# Patient Record
Sex: Male | Born: 1965
Health system: Southern US, Community
[De-identification: ages and names within clinical notes are randomized; demographics above are authoritative.]

---

## 2016-01-30 DIAGNOSIS — Z0000 Encounter for general adult medical examination without abnormal findings: Secondary | ICD-10-CM | POA: Diagnosis not present

## 2016-11-08 DIAGNOSIS — Z23 Encounter for immunization: Secondary | ICD-10-CM | POA: Diagnosis not present

## 2017-03-07 DIAGNOSIS — Z0000 Encounter for general adult medical examination without abnormal findings: Secondary | ICD-10-CM | POA: Diagnosis not present

## 2018-04-17 DIAGNOSIS — Z125 Encounter for screening for malignant neoplasm of prostate: Secondary | ICD-10-CM | POA: Diagnosis not present

## 2018-04-17 DIAGNOSIS — Z0000 Encounter for general adult medical examination without abnormal findings: Secondary | ICD-10-CM | POA: Diagnosis not present

## 2018-04-17 DIAGNOSIS — Z131 Encounter for screening for diabetes mellitus: Secondary | ICD-10-CM | POA: Diagnosis not present

## 2018-04-17 DIAGNOSIS — Z136 Encounter for screening for cardiovascular disorders: Secondary | ICD-10-CM | POA: Diagnosis not present

## 2018-06-12 DIAGNOSIS — K648 Other hemorrhoids: Secondary | ICD-10-CM | POA: Diagnosis not present

## 2018-06-12 DIAGNOSIS — D126 Benign neoplasm of colon, unspecified: Secondary | ICD-10-CM | POA: Diagnosis not present

## 2018-06-12 DIAGNOSIS — Z1211 Encounter for screening for malignant neoplasm of colon: Secondary | ICD-10-CM | POA: Diagnosis not present

## 2018-06-12 DIAGNOSIS — K5730 Diverticulosis of large intestine without perforation or abscess without bleeding: Secondary | ICD-10-CM | POA: Diagnosis not present

## 2018-06-17 DIAGNOSIS — Z1211 Encounter for screening for malignant neoplasm of colon: Secondary | ICD-10-CM | POA: Diagnosis not present

## 2018-06-17 DIAGNOSIS — D126 Benign neoplasm of colon, unspecified: Secondary | ICD-10-CM | POA: Diagnosis not present

## 2019-06-26 DIAGNOSIS — Z0000 Encounter for general adult medical examination without abnormal findings: Secondary | ICD-10-CM | POA: Diagnosis not present

## 2019-07-10 DIAGNOSIS — Z13220 Encounter for screening for lipoid disorders: Secondary | ICD-10-CM | POA: Diagnosis not present

## 2019-07-10 DIAGNOSIS — Z0000 Encounter for general adult medical examination without abnormal findings: Secondary | ICD-10-CM | POA: Diagnosis not present

## 2020-06-27 DIAGNOSIS — Z0000 Encounter for general adult medical examination without abnormal findings: Secondary | ICD-10-CM | POA: Diagnosis not present

## 2020-06-27 DIAGNOSIS — Z131 Encounter for screening for diabetes mellitus: Secondary | ICD-10-CM | POA: Diagnosis not present

## 2020-06-27 DIAGNOSIS — Z125 Encounter for screening for malignant neoplasm of prostate: Secondary | ICD-10-CM | POA: Diagnosis not present

## 2020-06-27 DIAGNOSIS — Z13220 Encounter for screening for lipoid disorders: Secondary | ICD-10-CM | POA: Diagnosis not present

## 2021-01-06 DIAGNOSIS — H0014 Chalazion left upper eyelid: Secondary | ICD-10-CM | POA: Diagnosis not present

## 2021-01-10 DIAGNOSIS — H0014 Chalazion left upper eyelid: Secondary | ICD-10-CM | POA: Diagnosis not present

## 2021-04-17 DIAGNOSIS — L723 Sebaceous cyst: Secondary | ICD-10-CM | POA: Diagnosis not present

## 2021-06-02 ENCOUNTER — Emergency Department (HOSPITAL_COMMUNITY): Payer: Worker's Compensation

## 2021-06-02 ENCOUNTER — Emergency Department (HOSPITAL_COMMUNITY)
Admission: EM | Admit: 2021-06-02 | Discharge: 2021-06-02 | Disposition: A | Discharge status: Home / Self Care | Payer: Worker's Compensation | Attending: Emergency Medicine | Admitting: Emergency Medicine

## 2021-06-02 DIAGNOSIS — W11XXXA Fall on and from ladder, initial encounter: Secondary | ICD-10-CM | POA: Insufficient documentation

## 2021-06-02 DIAGNOSIS — S53105A Unspecified dislocation of left ulnohumeral joint, initial encounter: Secondary | ICD-10-CM | POA: Diagnosis not present

## 2021-06-02 DIAGNOSIS — S52042D Displaced fracture of coronoid process of left ulna, subsequent encounter for closed fracture with routine healing: Secondary | ICD-10-CM | POA: Diagnosis not present

## 2021-06-02 DIAGNOSIS — M25512 Pain in left shoulder: Secondary | ICD-10-CM | POA: Diagnosis not present

## 2021-06-02 DIAGNOSIS — S42402A Unspecified fracture of lower end of left humerus, initial encounter for closed fracture: Secondary | ICD-10-CM

## 2021-06-02 DIAGNOSIS — S53102A Unspecified subluxation of left ulnohumeral joint, initial encounter: Secondary | ICD-10-CM | POA: Diagnosis not present

## 2021-06-02 DIAGNOSIS — S59901A Unspecified injury of right elbow, initial encounter: Secondary | ICD-10-CM | POA: Diagnosis not present

## 2021-06-02 DIAGNOSIS — S53105D Unspecified dislocation of left ulnohumeral joint, subsequent encounter: Secondary | ICD-10-CM | POA: Diagnosis not present

## 2021-06-02 MED ORDER — PROPOFOL 10 MG/ML IV BOLUS
40.0000 mg | Freq: Once | INTRAVENOUS | Status: AC
  Filled 2021-06-02: qty 20

## 2021-06-02 MED ORDER — OXYCODONE-ACETAMINOPHEN 5-325 MG PO TABS: 1.0000 | ORAL_TABLET | Freq: Three times a day (TID) | ORAL | 0 refills | Status: AC | PRN

## 2021-06-02 MED ORDER — FENTANYL CITRATE PF 50 MCG/ML IJ SOSY
50.0000 ug | PREFILLED_SYRINGE | Freq: Once | INTRAMUSCULAR | Status: AC
Start: 2021-06-02 — End: 2021-06-02
  Administered 2021-06-02: 50 ug via INTRAMUSCULAR
  Filled 2021-06-02: qty 1

## 2021-06-02 MED ORDER — PROPOFOL 10 MG/ML IV BOLUS
0.5000 mg/kg | Freq: Once | INTRAVENOUS | Status: AC
  Administered 2021-06-02: 39.5 mg via INTRAVENOUS

## 2021-06-02 MED ORDER — OXYCODONE-ACETAMINOPHEN 5-325 MG PO TABS
1.0000 | ORAL_TABLET | Freq: Once | ORAL | Status: AC
  Administered 2021-06-02: 1 via ORAL
  Filled 2021-06-02: qty 1

## 2021-06-02 MED ORDER — HYDROMORPHONE HCL 1 MG/ML IJ SOLN
1.0000 mg | Freq: Once | INTRAMUSCULAR | Status: AC
  Administered 2021-06-02: 1 mg via INTRAVENOUS
  Filled 2021-06-02: qty 1

## 2021-06-02 MED ORDER — PROPOFOL 10 MG/ML IV BOLUS
INTRAVENOUS | Status: AC
  Administered 2021-06-02: 40 mg via INTRAVENOUS
  Filled 2021-06-02: qty 20

## 2021-06-02 MED ORDER — FENTANYL CITRATE PF 50 MCG/ML IJ SOSY
75.0000 ug | PREFILLED_SYRINGE | Freq: Once | INTRAMUSCULAR | Status: AC
  Administered 2021-06-02: 75 ug via INTRAVENOUS
  Filled 2021-06-02: qty 2

## 2021-06-02 MED ORDER — SODIUM CHLORIDE 0.9 % IV BOLUS
1000.0000 mL | Freq: Once | INTRAVENOUS | Status: AC
  Administered 2021-06-02: 1000 mL via INTRAVENOUS

## 2021-06-02 NOTE — ED Provider Notes (Addendum)
[facility] EMERGENCY DEPARTMENT Provider Note   CSN: [id number] Arrival date & time: 06/02/21  1642     History Chief Complaint  Patient presents with   Marletta Lor    Jeffery Lyons is a 55 y.o. male.  HPI  Patient with no significant medical history presents to the emergency department with chief complaint of left elbow pain.  He states approximately 1 hour ago he was on a 4 foot step ladder, fell onto his left side, denies hitting his head, losing conscious, is not on anticoagulant.  He does not endorse neck, back pain, chest pain, abdominal pain, denies any hip or leg pain.  Is able to ambulate.  He does endorse severe pain at his left elbow, able to move his fingers and wrist, unable to bend at the elbow, slightly tender at the shoulder.  Patient has no other complaints this time.  He is not taking any medication for this.    No past medical history on file.  There are no problems to display for this patient.      No family history on file.     Home Medications Prior to Admission medications   Not on File    Allergies    Patient has no known allergies.  Review of Systems   Review of Systems  Constitutional:  Negative for chills and fever.  HENT:  Negative for congestion.   Respiratory:  Negative for shortness of breath.   Cardiovascular:  Negative for chest pain.  Gastrointestinal:  Negative for abdominal pain.  Genitourinary:  Negative for enuresis.  Musculoskeletal:  Negative for back pain and neck pain.       Left elbow pain.  Skin:  Negative for rash.  Neurological:  Negative for dizziness and headaches.  Hematological:  Does not bruise/bleed easily.   Physical Exam Updated Vital Signs BP (!) 160/94 (BP Location: Right Arm)   Pulse (!) 56   Temp (!) 97.4 F (36.3 C) (Oral)   Resp 20   SpO2 95%   Physical Exam Vitals and nursing note reviewed.  Constitutional:      General: He is in acute distress.     Appearance: He is not ill-appearing.  HENT:      Head: Normocephalic and atraumatic.     Comments: No gross deformities noted on patient's head    Nose: No congestion.  Eyes:     Conjunctiva/sclera: Conjunctivae normal.  Cardiovascular:     Rate and Rhythm: Normal rate and regular rhythm.     Pulses: Normal pulses.     Heart sounds: No murmur heard.   No friction rub. No gallop.  Pulmonary:     Effort: No respiratory distress.     Breath sounds: No wheezing, rhonchi or rales.  Chest:     Chest wall: No tenderness.  Musculoskeletal:     Comments: Patient spine was palpated nontender to palpation, no step-off or deformities present.  Patient's left elbow was visualized appears to be deformed, unable to flex or extend due to severe pain, has full range of motion in his fingers and wrist, neurovascular intact. cannot access mobility of the shoulder due to pain.  Patient was ambulating without difficulty.  Skin:    General: Skin is warm and dry.  Neurological:     Mental Status: He is alert.     Comments: No facial asymmetry, no difficult word finding, no unilateral weakness present.  No gait disturbances  Psychiatric:        Mood and  Affect: Mood normal.    ED Results / Procedures / Treatments   Labs (all labs ordered are listed, but only abnormal results are displayed) Labs Reviewed - No data to display  EKG None  Radiology No results found.  Procedures Reduction of dislocation  Date/Time: 06/02/2021 9:54 PM Performed by: Carroll Sage, PA-C Authorized by: Carroll Sage, PA-C  Consent: Verbal consent obtained. Risks and benefits: risks, benefits and alternatives were discussed Consent given by: patient Required items: required blood products, implants, devices, and special equipment available Patient identity confirmed: verbally with patient Time out: Immediately prior to procedure a "time out" was called to verify the correct patient, procedure, equipment, support staff and site/side marked as  required. Preparation: Patient was prepped and draped in the usual sterile fashion. Local anesthesia used: no  Anesthesia: Local anesthesia used: no  Sedation: Patient sedated: yes Sedation type: moderate (conscious) sedation Sedatives: propofol Sedation start date/time: 06/02/2021 8:46 PM Sedation end date/time: 06/02/2021 8:57 PM Vitals: Vital signs were monitored during sedation.  Patient tolerance: patient tolerated the procedure well with no immediate complications   Reduction of dislocation  Date/Time: 06/02/2021 10:06 PM Performed by: Carroll Sage, PA-C Authorized by: Carroll Sage, PA-C  Consent: Verbal consent obtained. Risks and benefits: risks, benefits and alternatives were discussed Consent given by: patient Patient identity confirmed: verbally with patient Time out: Immediately prior to procedure a "time out" was called to verify the correct patient, procedure, equipment, support staff and site/side marked as required. Preparation: Patient was prepped and draped in the usual sterile fashion. Local anesthesia used: no  Anesthesia: Local anesthesia used: no  Sedation: Patient sedated: no  Patient tolerance: patient tolerated the procedure well with no immediate complications     Medications Ordered in ED Medications  fentaNYL (SUBLIMAZE) injection 50 mcg (50 mcg Intramuscular Given 06/02/21 1706)    ED Course  I have reviewed the triage vital signs and the nursing notes.  Pertinent labs & imaging results that were available during my care of the patient were reviewed by me and considered in my medical decision making (see chart for details).    MDM Rules/Calculators/A&P                          Initial impression-presents with left elbow pain, he is alert, appears to be in acute distress, vital signs noted for bradycardia.  Concern for orthopedic injury of the left elbow possible left shoulder, will provide patient with pain medication, obtain  imaging and reassess.  Work-up-DG of left shoulder was unremarkable, x-ray of left elbow shows fracture with dislocation.   Reassessment-due to fracture and dislocation will speak with orthopedic surgery for further evaluation.  while using Spanish interpreter went over risks and benefits of a conscious sedation for reduction of fracture all questions were answered patient was in agreement with procedure.  With attending, respiratory, nursing staff, suction, BVM assessable elbow was reduced and placed in a splint.  Patient tolerated the procedure well.  Neurovascular fully intact.  Plain films obtained continues to have a dislocated fracture.  Will consult with hand surgery for further recommendations.  Updated patient recommendation from hand surgery, patient is agreement this plan, patient tolerated the procedure well, neurovascular fully intact.  Third set of plain films obtained unfortunately, elbow was not fully reduced.  Will speak with Dr. Stark Jock for further evaluation.  Consult-   1.  Spoke with Dr. Linna Caprice who recommends reducing and speaking with  hand surgeon for further evaluation.  2.  Dr. Gwenlyn Fudge spoke with Dr. Orlan Leavens of hand surgery he recommends additional attempt to try to fully reduce the dislocation placing the patient in 90 degree flexion.  If successful patient to follow-up on outpatient setting, if unsuccessful repage Dr. Stark Jock for further evaluation.  3. Spoke with Dr. Orlan Leavens explained that the elbow was not successfully reduced and asked if this can be treated as an outpatient vs inpatient, he states that this can be seen on outpatient setting and he will see the patient on Tuesday.  Rule out- I have low suspicion for septic arthritis as patient denies IV drug use, skin exam was performed no erythematous, edematous, warm joints noted on exam, no new heart murmur heard on exam.  Low suspicion for compartment syndrome as area was palpated it was soft to the touch,  neurovascular fully intact.   Plan-  Elbow pain-likely from dislocated and fracture, patient was placed in a splint, and a sling, will provide him with pain medications, have him follow-up with Dr. Orlan Leavens on Tuesday for further evaluation.  Vital signs have remained stable, no indication for hospital admission.  Patient discussed with attending and they agreed with assessment and plan.  Patient given at home care as well strict return precautions.  Patient verbalized that they understood agreed to said plan.  Final Clinical Impression(s) / ED Diagnoses Final diagnoses:  None    Rx / DC Orders ED Discharge Orders     None        Carroll Sage, PA-C 06/02/21 2313    Carroll Sage, PA-C 06/02/21 2315    Pricilla Loveless, MD 06/02/21 2358

## 2021-06-02 NOTE — ED Provider Notes (Signed)
.  Sedation  Date/Time: 06/02/2021 7:48 PM Performed by: Pricilla Loveless, MD Authorized by: Pricilla Loveless, MD   Consent:    Consent obtained:  Verbal and written   Consent given by:  Patient Universal protocol:    Immediately prior to procedure, a time out was called: yes     Patient identity confirmed:  Verbally with patient Indications:    Procedure performed:  Dislocation reduction   Procedure necessitating sedation performed by:  Physician performing sedation Pre-sedation assessment:    Time since last food or drink:  17 hours   ASA classification: class 1 - normal, healthy patient     Mallampati score:  III - soft palate, base of uvula visible   Pre-sedation assessments completed and reviewed: airway patency, cardiovascular function, hydration status, mental status, nausea/vomiting, pain level, respiratory function and temperature   Immediate pre-procedure details:    Reassessment: Patient reassessed immediately prior to procedure     Reviewed: vital signs, relevant labs/tests and NPO status     Verified: bag valve mask available, emergency equipment available, intubation equipment available, IV patency confirmed, oxygen available and suction available   Procedure details (see MAR for exact dosages):    Preoxygenation:  Nasal cannula   Sedation:  Propofol   Intended level of sedation: deep   Intra-procedure monitoring:  Blood pressure monitoring, cardiac monitor, continuous pulse oximetry, continuous capnometry, frequent LOC assessments and frequent vital sign checks   Intra-procedure events: none     Total Provider sedation time (minutes):  8 Post-procedure details:    Post-sedation assessments completed and reviewed: airway patency, cardiovascular function, hydration status, mental status, nausea/vomiting, pain level, respiratory function and temperature     Patient is stable for discharge or admission: yes     Procedure completion:  Tolerated well, no immediate complications     Pricilla Loveless, MD 06/02/21 2354

## 2021-06-02 NOTE — Discharge Instructions (Signed)
You have dislocated and fractured your elbow I have placed you in a splint and sling please do not get it wet and remain in the sling at all times.I have given you a short course of narcotics please take as prescribed.  This medication can make you drowsy do not consume alcohol or operate heavy machinery when taking this medication.  This medication is Tylenol in it do not take Tylenol and take this medication.   Please call hand surgery for further evaluation, Dr. Orlan Leavens would like to see you on Tuesday.  Come back to the emergency department if you develop chest pain, shortness of breath, severe abdominal pain, uncontrolled nausea, vomiting, diarrhea.

## 2021-06-02 NOTE — ED Triage Notes (Signed)
Larey Seat off a ladder , deformity left arm

## 2021-06-02 NOTE — ED Notes (Signed)
Assisted with splinting left arm once again. Pt states pain and numbness in fingers has improved since removal of second splint.

## 2021-06-02 NOTE — ED Notes (Signed)
Propofol 40 mg given x3. 3729,0211, [id number]

## 2021-06-05 MED FILL — Oxycodone w/ Acetaminophen Tab 5-325 MG: ORAL | Qty: 6 | Status: AC

## 2021-06-06 DIAGNOSIS — S53115A Anterior dislocation of left ulnohumeral joint, initial encounter: Secondary | ICD-10-CM | POA: Diagnosis not present

## 2021-06-07 ENCOUNTER — Other Ambulatory Visit: Payer: Self-pay | Admitting: Orthopedic Surgery

## 2021-06-07 DIAGNOSIS — M25522 Pain in left elbow: Secondary | ICD-10-CM

## 2021-06-07 DIAGNOSIS — S53105A Unspecified dislocation of left ulnohumeral joint, initial encounter: Secondary | ICD-10-CM

## 2021-06-09 ENCOUNTER — Ambulatory Visit
Admission: RE | Admit: 2021-06-09 | Discharge: 2021-06-09 | Disposition: A | Discharge status: Home / Self Care | Payer: BC Managed Care – PPO | Source: Ambulatory Visit | Attending: Orthopedic Surgery | Admitting: Orthopedic Surgery

## 2021-06-09 DIAGNOSIS — S52042A Displaced fracture of coronoid process of left ulna, initial encounter for closed fracture: Secondary | ICD-10-CM | POA: Diagnosis not present

## 2021-06-09 DIAGNOSIS — M7989 Other specified soft tissue disorders: Secondary | ICD-10-CM | POA: Diagnosis not present

## 2021-06-09 DIAGNOSIS — S53105A Unspecified dislocation of left ulnohumeral joint, initial encounter: Secondary | ICD-10-CM

## 2021-06-09 DIAGNOSIS — S53115A Anterior dislocation of left ulnohumeral joint, initial encounter: Secondary | ICD-10-CM | POA: Diagnosis not present

## 2021-06-09 DIAGNOSIS — R937 Abnormal findings on diagnostic imaging of other parts of musculoskeletal system: Secondary | ICD-10-CM | POA: Diagnosis not present

## 2021-06-09 DIAGNOSIS — M25522 Pain in left elbow: Secondary | ICD-10-CM

## 2021-06-14 DIAGNOSIS — S5332XA Traumatic rupture of left ulnar collateral ligament, initial encounter: Secondary | ICD-10-CM | POA: Diagnosis not present

## 2021-06-14 DIAGNOSIS — S52022A Displaced fracture of olecranon process without intraarticular extension of left ulna, initial encounter for closed fracture: Secondary | ICD-10-CM | POA: Diagnosis not present

## 2021-06-14 DIAGNOSIS — W19XXXA Unspecified fall, initial encounter: Secondary | ICD-10-CM | POA: Diagnosis not present

## 2021-06-14 DIAGNOSIS — G8918 Other acute postprocedural pain: Secondary | ICD-10-CM | POA: Diagnosis not present

## 2021-06-14 DIAGNOSIS — S53145A Lateral dislocation of left ulnohumeral joint, initial encounter: Secondary | ICD-10-CM | POA: Diagnosis not present

## 2021-06-14 DIAGNOSIS — X58XXXA Exposure to other specified factors, initial encounter: Secondary | ICD-10-CM | POA: Diagnosis not present

## 2021-06-14 DIAGNOSIS — S5322XA Traumatic rupture of left radial collateral ligament, initial encounter: Secondary | ICD-10-CM | POA: Diagnosis not present

## 2021-06-14 DIAGNOSIS — S53115A Anterior dislocation of left ulnohumeral joint, initial encounter: Secondary | ICD-10-CM | POA: Diagnosis not present

## 2021-06-14 DIAGNOSIS — S52092A Other fracture of upper end of left ulna, initial encounter for closed fracture: Secondary | ICD-10-CM | POA: Diagnosis not present

## 2021-06-27 DIAGNOSIS — S53115A Anterior dislocation of left ulnohumeral joint, initial encounter: Secondary | ICD-10-CM | POA: Diagnosis not present

## 2021-07-04 DIAGNOSIS — S53115A Anterior dislocation of left ulnohumeral joint, initial encounter: Secondary | ICD-10-CM | POA: Diagnosis not present

## 2021-07-05 DIAGNOSIS — L723 Sebaceous cyst: Secondary | ICD-10-CM | POA: Diagnosis not present

## 2021-07-13 DIAGNOSIS — L720 Epidermal cyst: Secondary | ICD-10-CM | POA: Diagnosis not present

## 2021-07-13 DIAGNOSIS — L723 Sebaceous cyst: Secondary | ICD-10-CM | POA: Diagnosis not present

## 2021-07-19 DIAGNOSIS — Z0000 Encounter for general adult medical examination without abnormal findings: Secondary | ICD-10-CM | POA: Diagnosis not present

## 2021-07-19 DIAGNOSIS — Z13220 Encounter for screening for lipoid disorders: Secondary | ICD-10-CM | POA: Diagnosis not present

## 2021-07-19 DIAGNOSIS — D126 Benign neoplasm of colon, unspecified: Secondary | ICD-10-CM | POA: Diagnosis not present

## 2021-07-19 DIAGNOSIS — Z131 Encounter for screening for diabetes mellitus: Secondary | ICD-10-CM | POA: Diagnosis not present

## 2021-07-19 DIAGNOSIS — Z23 Encounter for immunization: Secondary | ICD-10-CM | POA: Diagnosis not present

## 2021-08-16 DIAGNOSIS — Z23 Encounter for immunization: Secondary | ICD-10-CM | POA: Diagnosis not present

## 2021-10-25 DIAGNOSIS — Z23 Encounter for immunization: Secondary | ICD-10-CM | POA: Diagnosis not present

## 2022-04-02 IMAGING — CT CT 3D INDEPENDENT WKST
4 of 6 series · 16 of 33 positions shown, 18 images · non-contrast
Comparison: Radiographs 06/02/2021.

CLINICAL DATA: Fall down stairs 1 week ago with elbow dislocation.

EXAM:
CT OF THE UPPER LEFT EXTREMITY WITHOUT CONTRAST
3-DIMENSIONAL CT IMAGE RENDERING ON INDEPENDENT WORKSTATION
TECHNIQUE: Multidetector CT imaging of the left elbow was performed according
to the standard protocol.
3-dimensional CT images were rendered by post-processing of the
original CT data on an independent workstation. The 3-dimensional CT
images were interpreted and findings were reported in the
accompanying complete CT report for this study

[Series 4: elbow 1.50 br60 s3 axial bone hd fov · axial · 0.31mm/px · z∈[-751,-642]mm · 5 of 207 slices shown, 7 images]
[im 35/207  soft-tissue]
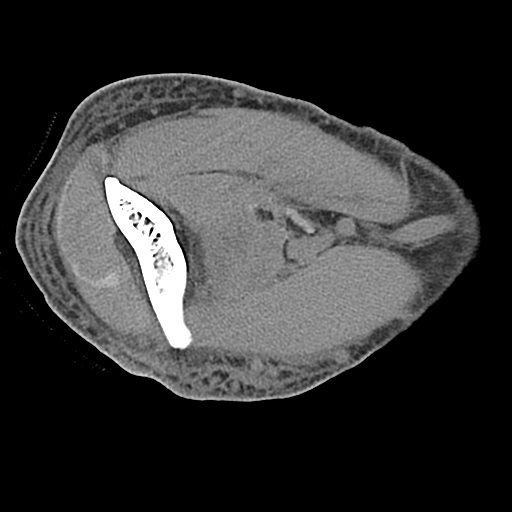
[im 35/207  bone]
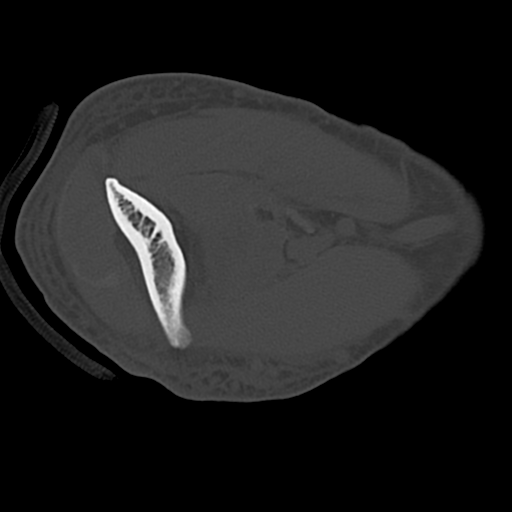
[im 69/207  bone]
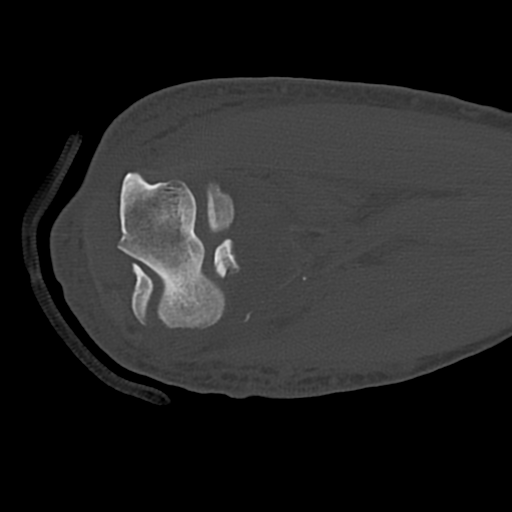
[im 104/207  bone]
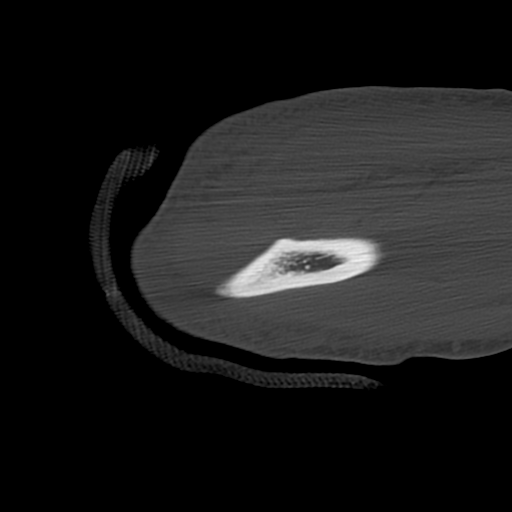
[im 138/207  bone]
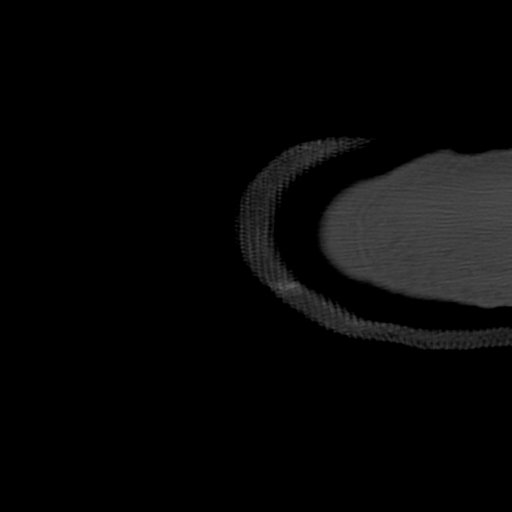
[im 172/207  soft-tissue]
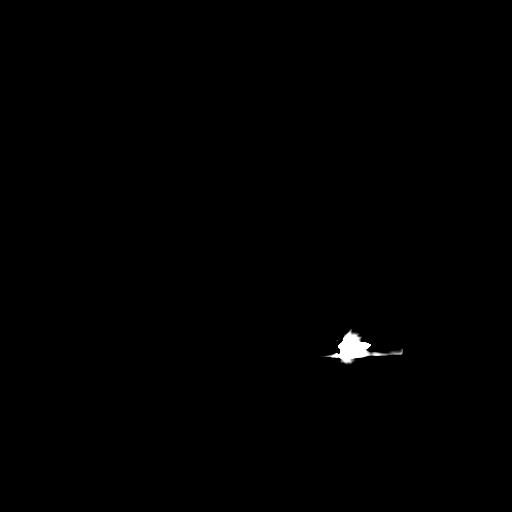
[im 172/207  bone]
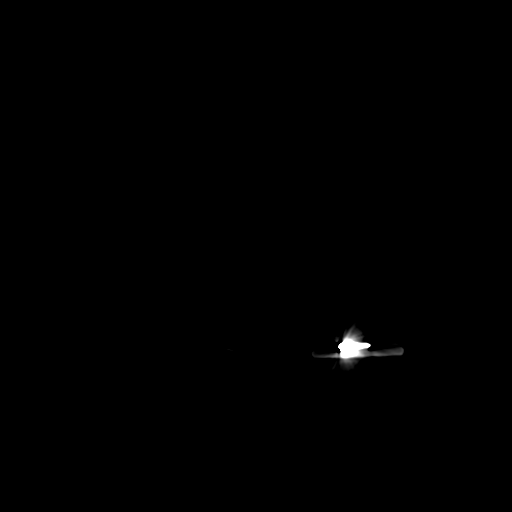

[Series 6: elbow 1.50 br40 s3 axial st hd fov · axial · 0.31mm/px · z∈[-751,-642]mm · 5 of 207 slices shown]
[im 35/207  bone]
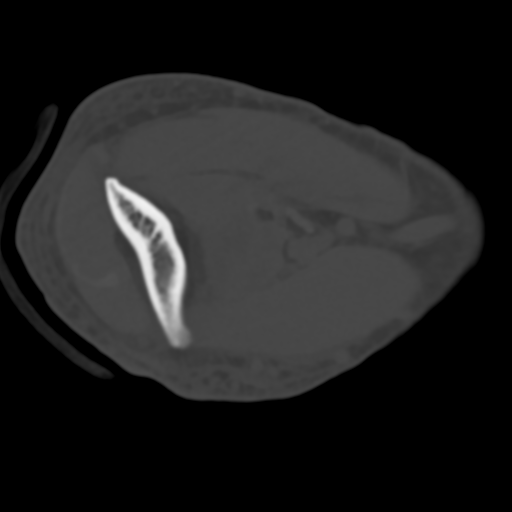
[im 69/207  bone]
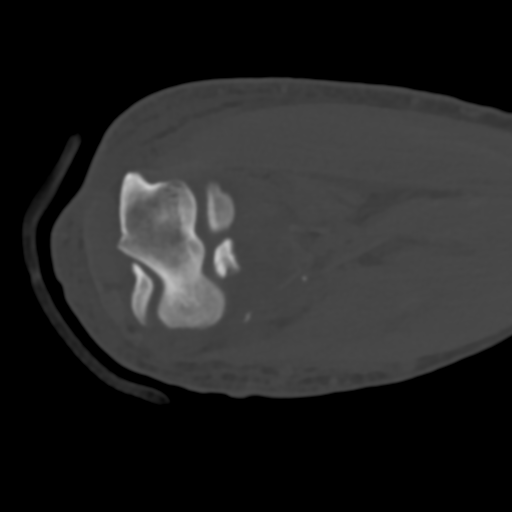
[im 104/207  bone]
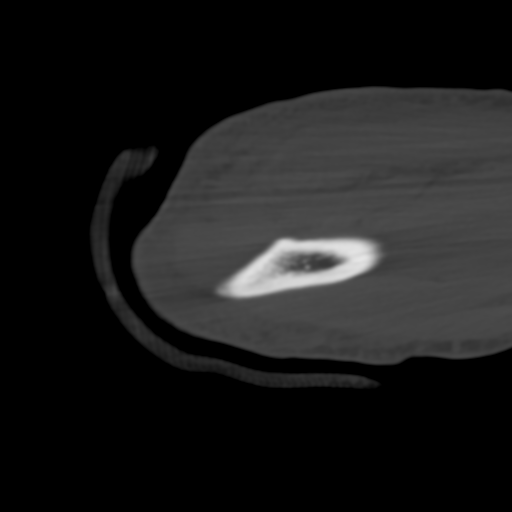
[im 138/207  bone]
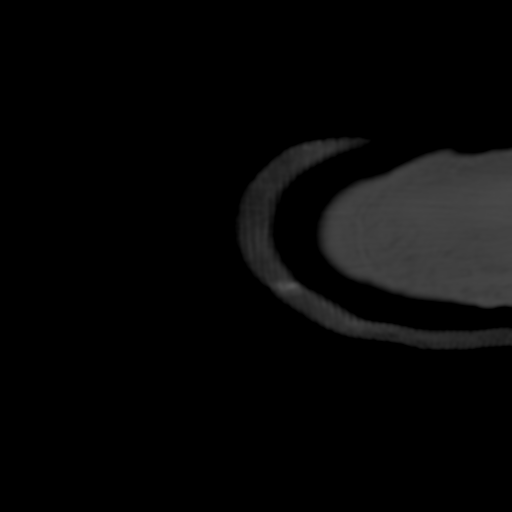
[im 172/207  bone]
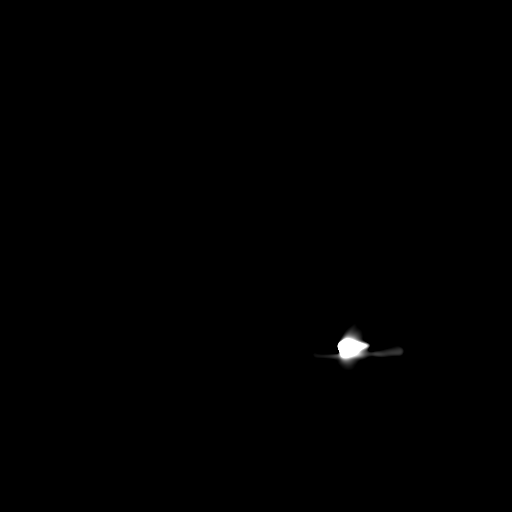

[Series 10: elbow 1.50 hr60 s3 sag bone hd fov · coronal · 0.31mm/px · 1 of 198 slices shown]
[im 99/198  bone]
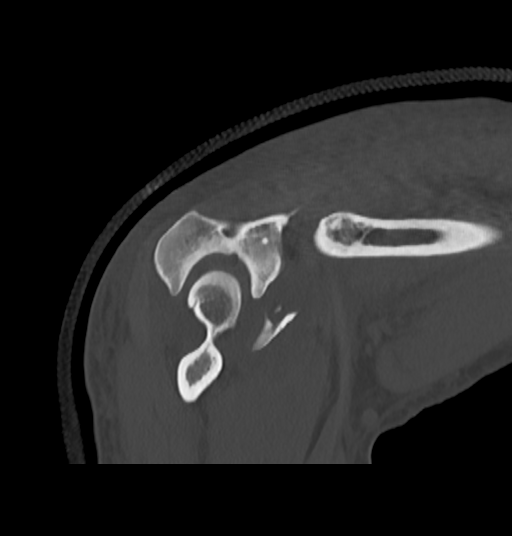

[Series 14: elbow 1.50 br60 s3 cor bone hd fov · sagittal · 0.31mm/px · 5 of 186 slices shown]
[im 31/186  bone]
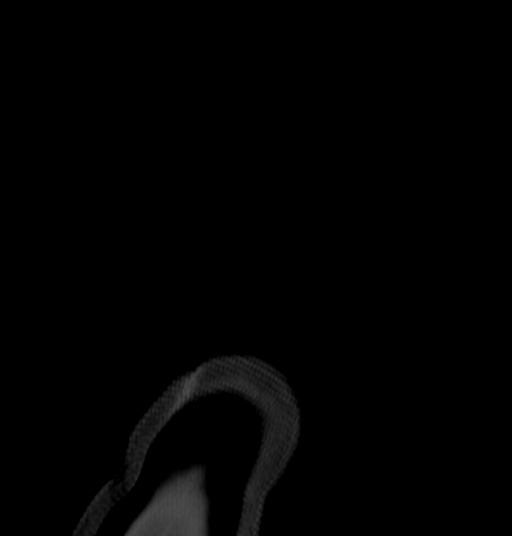
[im 62/186  bone]
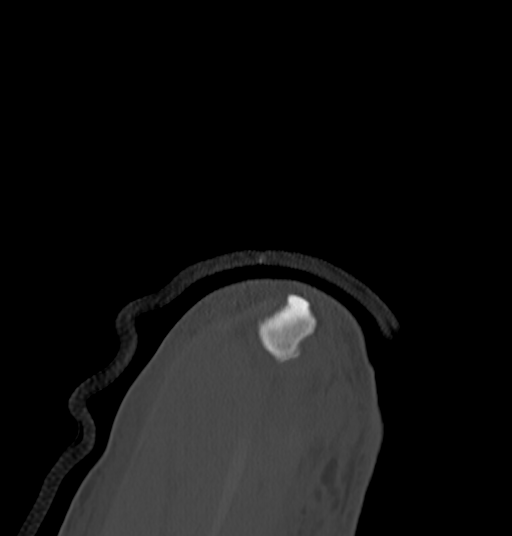
[im 93/186  bone]
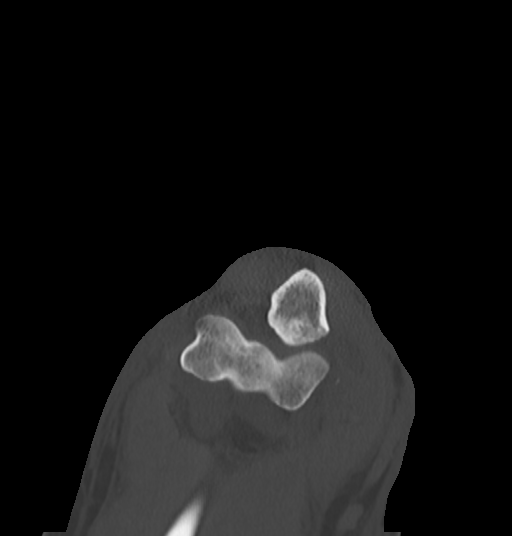
[im 124/186  bone]
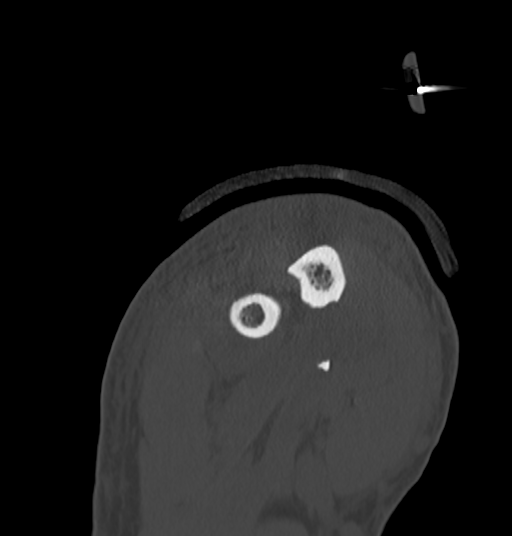
[im 155/186  bone]
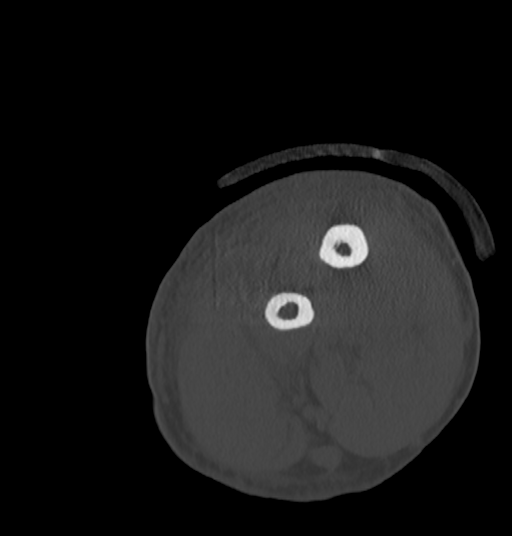

[16 of 33 positions shown; findings below may reference images not displayed]

FINDINGS: Bones/Joint/Cartilage

The elbow is splinted. The dislocation has been reduced. There is a
comminuted and displaced fracture of the coronoid process of the
proximal ulna with multiple intra-articular fracture fragments. The
radial head appears intact. No definite fracture of the distal
humerus. There is a moderate-sized elbow joint effusion.

Ligaments

Suboptimally assessed by CT.

Muscles and Tendons

The biceps and triceps tendons appear intact. No focal muscular
hematoma.

Soft tissues

Moderate soft tissue swelling around the elbow without foreign body
or soft tissue emphysema.
IMPRESSION: 1. Comminuted and displaced intra-articular fracture of the proximal
ulna as described. There is a hemarthrosis with multiple
intra-articular fracture fragments.
2. The radial head and distal humerus appear intact.
3. The elbow dislocation has been reduced.

## 2022-06-27 DIAGNOSIS — L209 Atopic dermatitis, unspecified: Secondary | ICD-10-CM | POA: Diagnosis not present

## 2022-06-27 DIAGNOSIS — Z9109 Other allergy status, other than to drugs and biological substances: Secondary | ICD-10-CM | POA: Diagnosis not present

## 2022-07-20 DIAGNOSIS — Z0000 Encounter for general adult medical examination without abnormal findings: Secondary | ICD-10-CM | POA: Diagnosis not present

## 2022-07-20 DIAGNOSIS — E7800 Pure hypercholesterolemia, unspecified: Secondary | ICD-10-CM | POA: Diagnosis not present

## 2022-07-20 DIAGNOSIS — Z131 Encounter for screening for diabetes mellitus: Secondary | ICD-10-CM | POA: Diagnosis not present

## 2022-07-20 DIAGNOSIS — Z23 Encounter for immunization: Secondary | ICD-10-CM | POA: Diagnosis not present

## 2022-07-20 DIAGNOSIS — Z125 Encounter for screening for malignant neoplasm of prostate: Secondary | ICD-10-CM | POA: Diagnosis not present

## 2022-12-24 DIAGNOSIS — R0789 Other chest pain: Secondary | ICD-10-CM | POA: Diagnosis not present

## 2022-12-24 DIAGNOSIS — K635 Polyp of colon: Secondary | ICD-10-CM | POA: Diagnosis not present

## 2022-12-31 ENCOUNTER — Ambulatory Visit
Admission: RE | Admit: 2022-12-31 | Discharge: 2022-12-31 | Disposition: A | Discharge status: Home / Self Care | Payer: BC Managed Care – PPO | Source: Ambulatory Visit | Attending: Family Medicine | Admitting: Family Medicine

## 2022-12-31 ENCOUNTER — Other Ambulatory Visit: Payer: Self-pay | Admitting: Family Medicine

## 2022-12-31 DIAGNOSIS — R52 Pain, unspecified: Secondary | ICD-10-CM

## 2022-12-31 DIAGNOSIS — R072 Precordial pain: Secondary | ICD-10-CM | POA: Diagnosis not present

## 2023-05-20 DIAGNOSIS — Z86010 Personal history of colonic polyps: Secondary | ICD-10-CM | POA: Diagnosis not present

## 2023-05-20 DIAGNOSIS — Z1211 Encounter for screening for malignant neoplasm of colon: Secondary | ICD-10-CM | POA: Diagnosis not present

## 2023-09-11 DIAGNOSIS — Z23 Encounter for immunization: Secondary | ICD-10-CM | POA: Diagnosis not present

## 2023-09-11 DIAGNOSIS — Z125 Encounter for screening for malignant neoplasm of prostate: Secondary | ICD-10-CM | POA: Diagnosis not present

## 2023-09-11 DIAGNOSIS — Z0000 Encounter for general adult medical examination without abnormal findings: Secondary | ICD-10-CM | POA: Diagnosis not present

## 2023-09-11 DIAGNOSIS — E7800 Pure hypercholesterolemia, unspecified: Secondary | ICD-10-CM | POA: Diagnosis not present

## 2024-09-11 DIAGNOSIS — R7301 Impaired fasting glucose: Secondary | ICD-10-CM | POA: Diagnosis not present

## 2024-09-11 DIAGNOSIS — Z23 Encounter for immunization: Secondary | ICD-10-CM | POA: Diagnosis not present

## 2024-09-11 DIAGNOSIS — R1013 Epigastric pain: Secondary | ICD-10-CM | POA: Diagnosis not present

## 2024-09-11 DIAGNOSIS — E7800 Pure hypercholesterolemia, unspecified: Secondary | ICD-10-CM | POA: Diagnosis not present

## 2024-09-11 DIAGNOSIS — Z0000 Encounter for general adult medical examination without abnormal findings: Secondary | ICD-10-CM | POA: Diagnosis not present

## 2024-09-11 DIAGNOSIS — Z125 Encounter for screening for malignant neoplasm of prostate: Secondary | ICD-10-CM | POA: Diagnosis not present

## 2024-09-11 DIAGNOSIS — L209 Atopic dermatitis, unspecified: Secondary | ICD-10-CM | POA: Diagnosis not present

## 2024-09-11 DIAGNOSIS — D126 Benign neoplasm of colon, unspecified: Secondary | ICD-10-CM | POA: Diagnosis not present
